# Patient Record
Sex: Female | Born: 2000 | Race: White | Hispanic: No | Marital: Single | Smoking: Never smoker
Health system: Southern US, Community
[De-identification: ages and names within clinical notes are randomized; demographics above are authoritative.]

---
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
## 2019-07-27 ENCOUNTER — Ambulatory Visit: Payer: 59 | Admitting: Women's Health

## 2019-08-14 ENCOUNTER — Other Ambulatory Visit: Payer: Self-pay

## 2019-08-17 ENCOUNTER — Ambulatory Visit: Payer: 59 | Admitting: Women's Health

## 2019-08-31 ENCOUNTER — Other Ambulatory Visit: Payer: Self-pay

## 2019-09-01 ENCOUNTER — Ambulatory Visit (INDEPENDENT_AMBULATORY_CARE_PROVIDER_SITE_OTHER): Payer: 59 | Admitting: Women's Health

## 2019-09-01 ENCOUNTER — Encounter: Payer: Self-pay | Admitting: Women's Health

## 2019-09-01 VITALS — BP 118/80 | Ht 69.0 in | Wt 136.0 lb

## 2019-09-01 DIAGNOSIS — N9411 Superficial (introital) dyspareunia: Secondary | ICD-10-CM

## 2019-09-01 DIAGNOSIS — Z01419 Encounter for gynecological examination (general) (routine) without abnormal findings: Secondary | ICD-10-CM | POA: Diagnosis not present

## 2019-09-01 LAB — CBC WITH DIFFERENTIAL/PLATELET
Absolute Monocytes: 599 cells/uL (ref 200–950)
Basophils Absolute: 32 cells/uL (ref 0–200)
Basophils Relative: 0.3 %
Eosinophils Absolute: 86 cells/uL (ref 15–500)
Eosinophils Relative: 0.8 %
HCT: 39.8 % (ref 35.0–45.0)
Hemoglobin: 13.6 g/dL (ref 11.7–15.5)
Lymphs Abs: 2140 cells/uL (ref 850–3900)
MCH: 31.9 pg (ref 27.0–33.0)
MCHC: 34.2 g/dL (ref 32.0–36.0)
MCV: 93.4 fL (ref 80.0–100.0)
MPV: 10.2 fL (ref 7.5–12.5)
Monocytes Relative: 5.6 %
Neutro Abs: 7843 cells/uL — ABNORMAL HIGH (ref 1500–7800)
Neutrophils Relative %: 73.3 %
Platelets: 292 10*3/uL (ref 140–400)
RBC: 4.26 10*6/uL (ref 3.80–5.10)
RDW: 10.9 % — ABNORMAL LOW (ref 11.0–15.0)
Total Lymphocyte: 20 %
WBC: 10.7 10*3/uL (ref 3.8–10.8)

## 2019-09-01 LAB — WET PREP FOR TRICH, YEAST, CLUE

## 2019-09-01 MED ORDER — FLUCONAZOLE 150 MG PO TABS: 150.0000 mg | ORAL_TABLET | Freq: Once | ORAL | 1 refills | Status: AC

## 2019-09-01 NOTE — Patient Instructions (Signed)
Pleasure knowing you! MVI daily Health Maintenance, Female Adopting a healthy lifestyle and getting preventive care are important in promoting health and wellness. Ask your health care provider about:  The right schedule for you to have regular tests and exams.  Things you can do on your own to prevent diseases and keep yourself healthy. What should I know about diet, weight, and exercise? Eat a healthy diet   Eat a diet that includes plenty of vegetables, fruits, low-fat dairy products, and lean protein.  Do not eat a lot of foods that are high in solid fats, added sugars, or sodium. Maintain a healthy weight Body mass index (BMI) is used to identify weight problems. It estimates body fat based on height and weight. Your health care provider can help determine your BMI and help you achieve or maintain a healthy weight. Get regular exercise Get regular exercise. This is one of the most important things you can do for your health. Most adults should:  Exercise for at least 150 minutes each week. The exercise should increase your heart rate and make you sweat (moderate-intensity exercise).  Do strengthening exercises at least twice a week. This is in addition to the moderate-intensity exercise.  Spend less time sitting. Even light physical activity can be beneficial. Watch cholesterol and blood lipids Have your blood tested for lipids and cholesterol at 20 years of age, then have this test every 5 years. Have your cholesterol levels checked more often if:  Your lipid or cholesterol levels are high.  You are older than 20 years of age.  You are at high risk for heart disease. What should I know about cancer screening? Depending on your health history and family history, you may need to have cancer screening at various ages. This may include screening for:  Breast cancer.  Cervical cancer.  Colorectal cancer.  Skin cancer.  Lung cancer. What should I know about heart  disease, diabetes, and high blood pressure? Blood pressure and heart disease  High blood pressure causes heart disease and increases the risk of stroke. This is more likely to develop in people who have high blood pressure readings, are of African descent, or are overweight.  Have your blood pressure checked: ? Every 3-5 years if you are 50-65 years of age. ? Every year if you are 20 years old or older. Diabetes Have regular diabetes screenings. This checks your fasting blood sugar level. Have the screening done:  Once every three years after age 20 if you are at a normal weight and have a low risk for diabetes.  More often and at a younger age if you are overweight or have a high risk for diabetes. What should I know about preventing infection? Hepatitis B If you have a higher risk for hepatitis B, you should be screened for this virus. Talk with your health care provider to find out if you are at risk for hepatitis B infection. Hepatitis C Testing is recommended for:  Everyone born from [birth year] through 1966.  Anyone with known risk factors for hepatitis C. Sexually transmitted infections (STIs)  Get screened for STIs, including gonorrhea and chlamydia, if: ? You are sexually active and are younger than 20 years of age. ? You are older than 20 years of age and your health care provider tells you that you are at risk for this type of infection. ? Your sexual activity has changed since you were last screened, and you are at increased risk for chlamydia or gonorrhea. Ask  your health care provider if you are at risk.  Ask your health care provider about whether you are at high risk for HIV. Your health care provider may recommend a prescription medicine to help prevent HIV infection. If you choose to take medicine to prevent HIV, you should first get tested for HIV. You should then be tested every 3 months for as long as you are taking the medicine. Pregnancy  If you are about to stop  having your period (premenopausal) and you may become pregnant, seek counseling before you get pregnant.  Take 400 to 800 micrograms (mcg) of folic acid every day if you become pregnant.  Ask for birth control (contraception) if you want to prevent pregnancy. Osteoporosis and menopause Osteoporosis is a disease in which the bones lose minerals and strength with aging. This can result in bone fractures. If you are 61 years old or older, or if you are at risk for osteoporosis and fractures, ask your health care provider if you should:  Be screened for bone loss.  Take a calcium or vitamin D supplement to lower your risk of fractures.  Be given hormone replacement therapy (HRT) to treat symptoms of menopause. Follow these instructions at home: Lifestyle  Do not use any products that contain nicotine or tobacco, such as cigarettes, e-cigarettes, and chewing tobacco. If you need help quitting, ask your health care provider.  Do not use street drugs.  Do not share needles.  Ask your health care provider for help if you need support or information about quitting drugs. Alcohol use  Do not drink alcohol if: ? Your health care provider tells you not to drink. ? You are pregnant, may be pregnant, or are planning to become pregnant.  If you drink alcohol: ? Limit how much you use to 0-1 drink a day. ? Limit intake if you are breastfeeding.  Be aware of how much alcohol is in your drink. In the [place] one drink equals one 12 oz bottle of beer (355 mL), one 5 oz glass of wine (148 mL), or one 1 oz glass of hard liquor (44 mL). General instructions  Schedule regular health, dental, and eye exams.  Stay current with your vaccines.  Tell your health care provider if: ? You often feel depressed. ? You have ever been abused or do not feel safe at home. Summary  Adopting a healthy lifestyle and getting preventive care are important in promoting health and wellness.  Follow your health  care provider's instructions about healthy diet, exercising, and getting tested or screened for diseases.  Follow your health care provider's instructions on monitoring your cholesterol and blood pressure. This information is not intended to replace advice given to you by your health care provider. Make sure you discuss any questions you have with your health care provider. Document Revised: 05/14/2018 Document Reviewed: 05/14/2018 Elsevier Patient Education  2020 [employer].

## 2019-09-01 NOTE — Progress Notes (Signed)
Anna Whitaker [birth year] [id number]    History:    Presents for annual exam.  Monthly cycle on Yaz with new onset dyspareunia and vaginal irritation.  Declined Gardasil.  First partner negative STD screen.  History of anxiety and depression doing much better no medication.  Past medical history, past surgical history, family history and social history were all reviewed and documented in the EPIC chart.  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  Parents healthy.  ROS:  A ROS was performed and pertinent positives and negatives are included.  Exam:  Vitals:   09/01/19 1435  BP: 118/80  Weight: 136 lb (61.7 kg)  Height: 5\' 9"  (1.753 m)   Body mass index is 20.08 kg/m.   General appearance:  Normal Thyroid:  Symmetrical, normal in size, without palpable masses or nodularity. Respiratory  Auscultation:  Clear without wheezing or rhonchi Cardiovascular  Auscultation:  Regular rate, without rubs, murmurs or gallops  Edema/varicosities:  Not grossly evident Abdominal  Soft,nontender, without masses, guarding or rebound.  Liver/spleen:  No organomegaly noted  Hernia:  None appreciated  Skin  Inspection:  Grossly normal   Breasts: Examined lying and sitting.     Right: Without masses, retractions, discharge or axillary adenopathy.     Left: Without masses, retractions, discharge or axillary adenopathy. Gentitourinary   Inguinal/mons:  Normal without inguinal adenopathy  External genitalia:  Normal  BUS/Urethra/Skene's glands:  Normal  Vagina: Mild erythema wet prep positive for yeast  Cervix:  Normal  Uterus:  normal in size, shape and contour.  Midline and mobile  Adnexa/parametria:     Rt: Without masses or tenderness.   Lt: Without masses or tenderness.  Anus and perineum: Normal    Assessment/Plan:  19 y.o. S WF G0 for annual exam with new  onset dyspareunia and vaginal irritation.  Monthly cycle on Yaz Yeast vaginitis  Plan: Yes prescription, proper use, slight risk for blood clots and strokes reviewed.  Condoms encouraged until permanent partner.  SBEs, exercise, calcium rich foods, and MVI daily encouraged.  Campus and driving safety discussed.  Diflucan 150 p.o. x1 dose.  Instructed to call if continued vaginal irritation and pain with intercourse.  Gardasil reviewed and encouraged declines.  CBC, Pap screening guidelines reviewed.    02-23-1984 [facility], 5:31 PM 09/01/2019

## 2019-09-02 LAB — URINALYSIS, COMPLETE W/RFL CULTURE
Bacteria, UA: NONE SEEN /HPF
Bilirubin Urine: NEGATIVE
Glucose, UA: NEGATIVE
Hgb urine dipstick: NEGATIVE
Hyaline Cast: NONE SEEN /LPF
Ketones, ur: NEGATIVE
Leukocyte Esterase: NEGATIVE
Nitrites, Initial: NEGATIVE
Protein, ur: NEGATIVE
RBC / HPF: NONE SEEN /HPF (ref 0–2)
Specific Gravity, Urine: 1.01 (ref 1.001–1.03)
Squamous Epithelial / LPF: NONE SEEN /HPF (ref <=5)
WBC, UA: NONE SEEN /HPF (ref 0–5)
pH: 7.5 (ref 5.0–8.0)

## 2019-09-02 LAB — NO CULTURE INDICATED

## 2020-06-08 ENCOUNTER — Encounter: Payer: Self-pay | Admitting: Nurse Practitioner

## 2020-06-08 ENCOUNTER — Ambulatory Visit (INDEPENDENT_AMBULATORY_CARE_PROVIDER_SITE_OTHER): Payer: No Typology Code available for payment source | Admitting: Nurse Practitioner

## 2020-06-08 ENCOUNTER — Other Ambulatory Visit: Payer: Self-pay

## 2020-06-08 VITALS — BP 115/80

## 2020-06-08 DIAGNOSIS — N760 Acute vaginitis: Secondary | ICD-10-CM | POA: Diagnosis not present

## 2020-06-08 DIAGNOSIS — Z113 Encounter for screening for infections with a predominantly sexual mode of transmission: Secondary | ICD-10-CM | POA: Diagnosis not present

## 2020-06-08 DIAGNOSIS — B3731 Acute candidiasis of vulva and vagina: Secondary | ICD-10-CM

## 2020-06-08 DIAGNOSIS — B373 Candidiasis of vulva and vagina: Secondary | ICD-10-CM

## 2020-06-08 DIAGNOSIS — N898 Other specified noninflammatory disorders of vagina: Secondary | ICD-10-CM

## 2020-06-08 DIAGNOSIS — Z3009 Encounter for other general counseling and advice on contraception: Secondary | ICD-10-CM

## 2020-06-08 DIAGNOSIS — B9689 Other specified bacterial agents as the cause of diseases classified elsewhere: Secondary | ICD-10-CM

## 2020-06-08 LAB — WET PREP FOR TRICH, YEAST, CLUE

## 2020-06-08 MED ORDER — METRONIDAZOLE 0.75 % VA GEL: 1.0000 | Freq: Two times a day (BID) | VAGINAL | 0 refills | Status: AC

## 2020-06-08 MED ORDER — FLUCONAZOLE 150 MG PO TABS: 150.0000 mg | ORAL_TABLET | ORAL | 0 refills | Status: DC

## 2020-06-08 NOTE — Patient Instructions (Signed)
Boric Acid 600 mg vaginal suppository twice per week Probiotic daily  Vaginal Yeast Infection, Adult  Vaginal yeast infection is a condition that causes vaginal discharge as well as soreness, swelling, and redness (inflammation) of the vagina. This is a common condition. Some women get this infection frequently. What are the causes? This condition is caused by a change in the normal balance of the yeast (candida) and bacteria that live in the vagina. This change causes an overgrowth of yeast, which causes the inflammation. What increases the risk? The condition is more likely to develop in women who:  Take antibiotic medicines.  Have diabetes.  Take birth control pills.  Are pregnant.  Douche often.  Have a weak body defense system (immune system).  Have been taking steroid medicines for a long time.  Frequently wear tight clothing. What are the signs or symptoms? Symptoms of this condition include:  White, thick, creamy vaginal discharge.  Swelling, itching, redness, and irritation of the vagina. The lips of the vagina (vulva) may be affected as well.  Pain or a burning feeling while urinating.  Pain during sex. How is this diagnosed? This condition is diagnosed based on:  Your medical history.  A physical exam.  A pelvic exam. Your health care provider will examine a sample of your vaginal discharge under a microscope. Your health care provider may send this sample for testing to confirm the diagnosis. How is this treated? This condition is treated with medicine. Medicines may be over-the-counter or prescription. You may be told to use one or more of the following:  Medicine that is taken by mouth (orally).  Medicine that is applied as a cream (topically).  Medicine that is inserted directly into the vagina (suppository). Follow these instructions at home:  Lifestyle  Do not have sex until your health care provider approves. Tell your sex partner that you  have a yeast infection. That person should go to his or her health care provider and ask if they should also be treated.  Do not wear tight clothes, such as pantyhose or tight pants.  Wear breathable cotton underwear. General instructions  Take or apply over-the-counter and prescription medicines only as told by your health care provider.  Eat more yogurt. This may help to keep your yeast infection from returning.  Do not use tampons until your health care provider approves.  Try taking a sitz bath to help with discomfort. This is a warm water bath that is taken while you are sitting down. The water should only come up to your hips and should cover your buttocks. Do this 3-4 times per day or as told by your health care provider.  Do not douche.  If you have diabetes, keep your blood sugar levels under control.  Keep all follow-up visits as told by your health care provider. This is important. Contact a health care provider if:  You have a fever.  Your symptoms go away and then return.  Your symptoms do not get better with treatment.  Your symptoms get worse.  You have new symptoms.  You develop blisters in or around your vagina.  You have blood coming from your vagina and it is not your menstrual period.  You develop pain in your abdomen. Summary  Vaginal yeast infection is a condition that causes discharge as well as soreness, swelling, and redness (inflammation) of the vagina.  This condition is treated with medicine. Medicines may be over-the-counter or prescription.  Take or apply over-the-counter and prescription medicines  only as told by your health care provider.  Do not douche. Do not have sex or use tampons until your health care provider approves.  Contact a health care provider if your symptoms do not get better with treatment or your symptoms go away and then return. This information is not intended to replace advice given to you by your health care  provider. Make sure you discuss any questions you have with your health care provider. Document Revised: 12/19/2018 Document Reviewed: 10/07/2017 Elsevier Patient Education  2020 Else[employer] Inc[employer] Bacterial Vaginosis  Bacterial vaginosis is an infection of the vagina. It happens when too many normal germs (healthy bacteria) grow in the vagina. This infection puts you at risk for infections from sex (STIs). Treating this infection can lower your risk for some STIs. You should also treat this if you are pregnant. It can cause your baby to be born early. Follow these instructions at home: Medicines  Take over-the-counter and prescription medicines only as told by your doctor.  Take or use your antibiotic medicine as told by your doctor. Do not stop taking or using it even if you start to feel better. General instructions  If you your sexual partner is a woman, tell her that you have this infection. She needs to get treatment if she has symptoms. If you have a female partner, he does not need to be treated.  During treatment: ? Avoid sex. ? Do not douche. ? Avoid alcohol as told. ? Avoid breastfeeding as told.  Drink enough fluid to keep your pee (urine) clear or pale yellow.  Keep your vagina and butt (rectum) clean. ? Wash the area with warm water every day. ? Wipe from front to back after you use the toilet.  Keep all follow-up visits as told by your doctor. This is important. Preventing this condition  Do not douche.  Use only warm water to wash around your vagina.  Use protection when you have sex. This includes: ? Latex condoms. ? Dental dams.  Limit how many people you have sex with. It is best to only have sex with the same person (be monogamous).  Get tested for STIs. Have your partner get tested.  Wear underwear that is cotton or lined with cotton.  Avoid tight pants and pantyhose. This is most important in summer.  Do not use any products that have nicotine or tobacco  in them. These include cigarettes and e-cigarettes. If you need help quitting, ask your doctor.  Do not use illegal drugs.  Limit how much alcohol you drink. Contact a doctor if:  Your symptoms do not get better, even after you are treated.  You have more discharge or pain when you pee (urinate).  You have a fever.  You have pain in your belly (abdomen).  You have pain with sex.  Your bleed from your vagina between periods. Summary  This infection happens when too many germs (bacteria) grow in the vagina.  Treating this condition can lower your risk for some infections from sex (STIs).  You should also treat this if you are pregnant. It can cause early (premature) birth.  Do not stop taking or using your antibiotic medicine even if you start to feel better. This information is not intended to replace advice given to you by your health care provider. Make sure you discuss any questions you have with your health care provider. Document Revised: 05/03/2017 Document Reviewed: 02/04/2016 Elsevier Patient Education  2020 [employer].

## 2020-06-08 NOTE — Progress Notes (Signed)
   Acute Office Visit  Subjective:    Patient ID: Anna Whitaker, female    DOB: [birth year], 19 y.o.   MRN: [id number]   HPI 20 y.o. presents today for green vaginal discharge and itching. She was treated for BV one month ago with PO flagyl. Symptoms improved while taking but then returned. She took a Diflucan 3 days ago for vaginal itching with some improvement. Sexually active. No contraception.    Review of Systems  Constitutional: Negative.   Genitourinary: Positive for vaginal discharge.       Vaginal itching       Objective:    Physical Exam Constitutional:      Appearance: Normal appearance.  Genitourinary:    General: Normal vulva.     Vagina: Vaginal discharge (green/yellow) and erythema present.     Cervix: Normal.     BP 115/80 (BP Location: Right Arm, Patient Position: Sitting, Cuff Size: Normal)   LMP 05/20/2020  Wt Readings from Last 3 Encounters:  09/01/19 136 lb (61.7 kg) (64 %, Z= 0.35)*  06/10/18 133 lb (60.3 kg) (64 %, Z= 0.35)*  04/24/16 126 lb (57.2 kg) (61 %, Z= 0.28)*   * Growth percentiles are based on CDC (Girls, 2-20 Years) data.   Wet prep + clue cells + yeast     Assessment & Plan:   Problem List Items Addressed This Visit   None   Visit Diagnoses    Bacterial vaginosis    -  Primary   Relevant Medications   fluconazole (DIFLUCAN) 150 MG tablet   metroNIDAZOLE (METROGEL) 0.75 % vaginal gel   Screen for STD (sexually transmitted disease)       Relevant Orders   WET PREP FOR TRICH, YEAST, CLUE   C. trachomatis/N. gonorrhoeae RNA   HIV Antibody (routine testing w rflx)   RPR   Vaginal discharge       Relevant Orders   WET PREP FOR TRICH, YEAST, CLUE   Vaginal candidiasis       Relevant Medications   fluconazole (DIFLUCAN) 150 MG tablet   Encounter for other general counseling and advice on contraception          Plan: Metrogel 0.75% twice daily x 4 days. Recommend starting Boric Acid vaginal suppositories twice weekly  and take daily women's probiotic to help prevent recurrent infection. Diflucan 150 mg tablet today and repeat in 3-5 days if symptoms persist. GC/Chlamydia, HIV, RPR pending. Discussed safe sex practices and condom use until permanent partner. Also recommended contraception for pregnancy prevention as well as dysmenorrhea. She is considering the IUD and will call office if she decides to move forward with it. She is currently living in [place] so she may have this done there. She has tried COCs and patch with side effects. She will return to office if symptoms worsen of do not improve. She is agreeable to plan.     Olivia Mackie [facility], 9:51 AM 06/08/2020

## 2020-06-09 LAB — HIV ANTIBODY (ROUTINE TESTING W REFLEX): HIV 1&2 Ab, 4th Generation: NONREACTIVE

## 2020-06-09 LAB — RPR: RPR Ser Ql: NONREACTIVE

## 2020-06-09 LAB — C. TRACHOMATIS/N. GONORRHOEAE RNA
C. trachomatis RNA, TMA: NOT DETECTED
N. gonorrhoeae RNA, TMA: NOT DETECTED

## 2020-11-09 ENCOUNTER — Ambulatory Visit (INDEPENDENT_AMBULATORY_CARE_PROVIDER_SITE_OTHER): Payer: No Typology Code available for payment source | Admitting: Nurse Practitioner

## 2020-11-09 ENCOUNTER — Encounter: Payer: Self-pay | Admitting: Nurse Practitioner

## 2020-11-09 ENCOUNTER — Other Ambulatory Visit: Payer: Self-pay

## 2020-11-09 VITALS — BP 114/74

## 2020-11-09 DIAGNOSIS — B9689 Other specified bacterial agents as the cause of diseases classified elsewhere: Secondary | ICD-10-CM | POA: Diagnosis not present

## 2020-11-09 DIAGNOSIS — N760 Acute vaginitis: Secondary | ICD-10-CM | POA: Diagnosis not present

## 2020-11-09 DIAGNOSIS — N898 Other specified noninflammatory disorders of vagina: Secondary | ICD-10-CM

## 2020-11-09 LAB — WET PREP FOR TRICH, YEAST, CLUE

## 2020-11-09 MED ORDER — METRONIDAZOLE 0.75 % VA GEL: 1.0000 | Freq: Every day | VAGINAL | 0 refills | Status: AC

## 2020-11-09 MED ORDER — METRONIDAZOLE 0.75 % VA GEL: 1.0000 | VAGINAL | 2 refills | Status: AC

## 2020-11-09 NOTE — Progress Notes (Signed)
   Acute Office Visit  Subjective:    Patient ID: Anna Whitaker, female    DOB: 2001-03-27, 19 y.o.   MRN: [id number]   HPI 20 y.o. presents today for vaginal discharge and odor that started a couple of weeks ago. She was seen in [place] for vaginal irritation at that time with negative STD screening and wet prep. She reports having BV 4 times since December and is frustrated with recurrences. She has not started boric acid or probiotics that was recommended at her last visit.   Review of Systems  Constitutional: Negative.   Genitourinary: Positive for vaginal discharge.       Vaginal odor       Objective:    Physical Exam Constitutional:      Appearance: Normal appearance.  Genitourinary:    General: Normal vulva.     Vagina: Vaginal discharge present. No erythema.     Cervix: Normal.     BP 114/74   LMP 11/02/2020  Wt Readings from Last 3 Encounters:  09/01/19 136 lb (61.7 kg) (64 %, Z= 0.35)*  06/10/18 133 lb (60.3 kg) (64 %, Z= 0.35)*  04/24/16 126 lb (57.2 kg) (61 %, Z= 0.28)*   * Growth percentiles are based on CDC (Girls, 2-20 Years) data.   Wet prep + clue cells     Assessment & Plan:   Problem List Items Addressed This Visit   None   Visit Diagnoses    Bacterial vaginosis    -  Primary   Relevant Medications   metroNIDAZOLE (METROGEL) 0.75 % vaginal gel   metroNIDAZOLE (METROGEL) 0.75 % vaginal gel   Vaginal discharge       Relevant Orders   WET PREP FOR TRICH, YEAST, CLUE     Plan: Wet prep positive for clue cells - Metrogel 0.75% nightly x 5 nights. Due to recurrences we will try Metrogel weekly x 12 weeks, starting 1 week after she finishes initial dose. If symptoms worsen or do not improve she will return to office. She is agreeable to plan.      Olivia Mackie DNP, 9:57 AM 11/09/2020

## 2020-12-15 ENCOUNTER — Other Ambulatory Visit: Payer: Self-pay | Admitting: Nurse Practitioner

## 2020-12-15 ENCOUNTER — Encounter: Payer: Self-pay | Admitting: Nurse Practitioner

## 2020-12-15 DIAGNOSIS — B3731 Acute candidiasis of vulva and vagina: Secondary | ICD-10-CM

## 2020-12-15 DIAGNOSIS — B373 Candidiasis of vulva and vagina: Secondary | ICD-10-CM

## 2020-12-15 MED ORDER — FLUCONAZOLE 150 MG PO TABS: 150.0000 mg | ORAL_TABLET | ORAL | 0 refills | Status: DC

## 2022-10-19 ENCOUNTER — Telehealth: Payer: Self-pay

## 2022-10-19 NOTE — Telephone Encounter (Signed)
Pt calling to report vulvar/vaginal irritation, burning, discharge, and some odor. Pt also reports the burning is noticeable w/ urinating but more just constant and in general.   Pt advised do not have availability in office today so would recommend nearest UC visit for eval and treatment.   Pt reports would rather treat sxs w/ OTC boric acid and wait for appt desk to call with hopefully a open visit early next week. Will send msg to appt desk but will route to provider for review also.   Last AEX 09/01/2019 w/ [place]. However, has been seen for OV/problems since. (Last visit 11/09/2020)

## 2022-10-25 ENCOUNTER — Ambulatory Visit: Payer: No Typology Code available for payment source | Admitting: Nurse Practitioner

## 2022-10-25 VITALS — BP 114/66

## 2022-10-25 DIAGNOSIS — Z113 Encounter for screening for infections with a predominantly sexual mode of transmission: Secondary | ICD-10-CM

## 2022-10-25 DIAGNOSIS — N760 Acute vaginitis: Secondary | ICD-10-CM | POA: Diagnosis not present

## 2022-10-25 DIAGNOSIS — R300 Dysuria: Secondary | ICD-10-CM

## 2022-10-25 DIAGNOSIS — B9689 Other specified bacterial agents as the cause of diseases classified elsewhere: Secondary | ICD-10-CM

## 2022-10-25 DIAGNOSIS — N898 Other specified noninflammatory disorders of vagina: Secondary | ICD-10-CM

## 2022-10-25 LAB — URINALYSIS, COMPLETE W/RFL CULTURE
Bacteria, UA: NONE SEEN /HPF
Bilirubin Urine: NEGATIVE
Glucose, UA: NEGATIVE
Hgb urine dipstick: NEGATIVE
Hyaline Cast: NONE SEEN /LPF
Ketones, ur: NEGATIVE
Leukocyte Esterase: NEGATIVE
Nitrites, Initial: NEGATIVE
Protein, ur: NEGATIVE
RBC / HPF: NONE SEEN /HPF (ref 0–2)
Specific Gravity, Urine: 1.01 (ref 1.001–1.035)
WBC, UA: NONE SEEN /HPF (ref 0–5)
pH: 6.5 (ref 5.0–8.0)

## 2022-10-25 LAB — WET PREP FOR TRICH, YEAST, CLUE

## 2022-10-25 LAB — NO CULTURE INDICATED

## 2022-10-25 MED ORDER — NUVESSA 1.3 % VA GEL
1.0000 | Freq: Once | VAGINAL | 0 refills | Status: AC
Start: 2022-10-25 — End: 2022-10-25

## 2022-10-25 MED ORDER — FLUCONAZOLE 150 MG PO TABS
150.0000 mg | ORAL_TABLET | ORAL | 0 refills | Status: AC
Start: 2022-10-25 — End: ?

## 2022-10-25 NOTE — Progress Notes (Signed)
   Acute Office Visit  Subjective:    Patient ID: KATHYRIA ESSIEN, female    DOB: [birth year], 22 y.o.   MRN: [id number]   HPI 22 y.o. G0 presents today for vaginal discharge, itching, irritation and slight odor x 1 week. Sexually active. Chlamydia x 3 this past year, history of recurrent yeast and BV with intercourse. Boric acid prn. Taking daily probiotic. Using sensitive soap, non-fragranted washing detergent. Would like UA today. Had some discomfort when urinating after a long drive this week.   Patient's last menstrual period was 10/09/2022 (exact date).    Review of Systems  Constitutional: Negative.   Genitourinary:  Positive for dysuria, vaginal discharge and vaginal pain (Irritation/itching). Negative for flank pain, frequency, hematuria and urgency.       Vaginal odor       Objective:    Physical Exam Constitutional:      Appearance: Normal appearance.  Genitourinary:    General: Normal vulva.     Vagina: Vaginal discharge present. No erythema.     Cervix: Normal.     BP 114/66 (BP Location: Left Arm, Patient Position: Sitting, Cuff Size: Normal)   LMP 10/09/2022 (Exact Date)  Wt Readings from Last 3 Encounters:  09/01/19 136 lb (61.7 kg) (64 %, Z= 0.35)*  06/10/18 133 lb (60.3 kg) (64 %, Z= 0.35)*  04/24/16 126 lb (57.2 kg) (61 %, Z= 0.28)*   * Growth percentiles are based on CDC (Girls, 2-20 Years) data.        Patient informed chaperone available to be present for breast and/or pelvic exam. Patient has requested no chaperone to be present. Patient has been advised what will be completed during breast and pelvic exam.   Wet prep + clue cells (+ odor) UA negative  Assessment & Plan:   Problem List Items Addressed This Visit   None Visit Diagnoses     Bacterial vaginosis    -  Primary   Relevant Medications   metroNIDAZOLE (NUVESSA) 1.3 % GEL      Vaginal discharge       Relevant Orders   SURESWAB CT/NG/T. vaginalis   WET PREP FOR TRICH,  YEAST, CLUE (Completed)   Screening examination for STD (sexually transmitted disease)       Relevant Orders   SURESWAB CT/NG/T. vaginalis   WET PREP FOR TRICH, YEAST, CLUE (Completed)   RPR   HIV Antibody (routine testing w rflx)   Acute vaginitis       Relevant Medications   fluconazole (DIFLUCAN) 150 MG tablet   Burning with urination       Relevant Orders   Urinalysis,Complete w/RFL Culture (Completed)      Plan: Wet prep positive for clue cells - Nuvessa 1.3% vaginal gel once. Diflucan 150 mg after completing Nuvessa d/t history of yeast infections with Metrogel. STD panel pending. Recommend boric acid suppositories twice weekly. UA negative.      Olivia Mackie DNP, 3:02 PM 10/25/2022

## 2022-10-26 LAB — SURESWAB CT/NG/T. VAGINALIS
C. trachomatis RNA, TMA: NOT DETECTED
N. gonorrhoeae RNA, TMA: NOT DETECTED
Trichomonas vaginalis RNA: NOT DETECTED

## 2022-10-27 LAB — RPR: RPR Ser Ql: NONREACTIVE

## 2022-10-27 LAB — HIV ANTIBODY (ROUTINE TESTING W REFLEX): HIV 1&2 Ab, 4th Generation: NONREACTIVE

## 2022-10-31 NOTE — Telephone Encounter (Signed)
Pt seen by TW on 10/25/2022. Will close encounter.
